# Patient Record
Sex: Female | Born: 1990 | Race: White | Hispanic: No | Marital: Married
Health system: Southern US, Community
[De-identification: ages and names within clinical notes are randomized; demographics above are authoritative.]

---
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
## 2010-05-11 ENCOUNTER — Emergency Department (HOSPITAL_COMMUNITY): Admission: EM | Admit: 2010-05-11 | Discharge: 2010-05-11 | Payer: Self-pay | Admitting: Emergency Medicine

## 2010-12-12 LAB — RAPID STREP SCREEN (MED CTR MEBANE ONLY): Streptococcus, Group A Screen (Direct): POSITIVE — AB

## 2012-02-23 ENCOUNTER — Other Ambulatory Visit (HOSPITAL_COMMUNITY)
Admission: RE | Admit: 2012-02-23 | Discharge: 2012-02-23 | Disposition: A | Discharge status: Home / Self Care | Payer: BC Managed Care – PPO | Source: Ambulatory Visit | Attending: Obstetrics and Gynecology | Admitting: Obstetrics and Gynecology

## 2012-02-23 DIAGNOSIS — Z113 Encounter for screening for infections with a predominantly sexual mode of transmission: Secondary | ICD-10-CM | POA: Insufficient documentation

## 2012-02-23 DIAGNOSIS — Z01419 Encounter for gynecological examination (general) (routine) without abnormal findings: Secondary | ICD-10-CM | POA: Insufficient documentation

## 2013-10-22 ENCOUNTER — Other Ambulatory Visit: Payer: Self-pay | Admitting: Adult Health

## 2013-12-01 ENCOUNTER — Telehealth: Payer: Self-pay | Admitting: *Deleted

## 2014-01-29 NOTE — Telephone Encounter (Signed)
Encounter Closed---01/29/14 TP 

## 2014-12-04 ENCOUNTER — Other Ambulatory Visit: Payer: Self-pay | Admitting: Adult Health
